# Patient Record
Sex: Female | Born: 1970 | Race: White | Hispanic: No | Marital: Married | Smoking: Never smoker
Health system: Southern US, Community
[De-identification: ages and names within clinical notes are randomized; demographics above are authoritative.]

## PROBLEM LIST (undated history)

## (undated) DIAGNOSIS — F32A Depression, unspecified: Secondary | ICD-10-CM

## (undated) DIAGNOSIS — B019 Varicella without complication: Secondary | ICD-10-CM

## (undated) DIAGNOSIS — F329 Major depressive disorder, single episode, unspecified: Secondary | ICD-10-CM

## (undated) DIAGNOSIS — G43909 Migraine, unspecified, not intractable, without status migrainosus: Secondary | ICD-10-CM

## (undated) HISTORY — DX: Varicella without complication: B01.9

## (undated) HISTORY — DX: Major depressive disorder, single episode, unspecified: F32.9

## (undated) HISTORY — PX: CERVICAL BIOPSY  W/ LOOP ELECTRODE EXCISION: SUR135

## (undated) HISTORY — DX: Migraine, unspecified, not intractable, without status migrainosus: G43.909

## (undated) HISTORY — DX: Depression, unspecified: F32.A

---
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
## 2007-11-24 LAB — HM MAMMOGRAPHY

## 2008-09-05 ENCOUNTER — Ambulatory Visit: Payer: Self-pay

## 2011-01-02 ENCOUNTER — Ambulatory Visit: Payer: Self-pay

## 2011-01-08 ENCOUNTER — Ambulatory Visit: Payer: Self-pay

## 2012-10-13 ENCOUNTER — Other Ambulatory Visit: Payer: Self-pay | Admitting: Dermatology

## 2012-10-13 LAB — CBC WITH DIFFERENTIAL/PLATELET
Eosinophil #: 0.1 10*3/uL (ref 0.0–0.7)
Eosinophil %: 1.1 %
Lymphocyte #: 2.2 10*3/uL (ref 1.0–3.6)
MCH: 26.9 pg (ref 26.0–34.0)
MCV: 82 fL (ref 80–100)
Monocyte #: 0.5 x10 3/mm (ref 0.2–0.9)
Neutrophil %: 63.6 %
Platelet: 281 10*3/uL (ref 150–440)
RBC: 4.69 10*6/uL (ref 3.80–5.20)
WBC: 7.6 10*3/uL (ref 3.6–11.0)

## 2012-10-13 LAB — TRIGLYCERIDES: Triglycerides: 114 mg/dL (ref 0–200)

## 2012-10-13 LAB — ALT: SGPT (ALT): 18 U/L (ref 12–78)

## 2012-12-05 ENCOUNTER — Other Ambulatory Visit: Payer: Self-pay | Admitting: Dermatology

## 2012-12-05 LAB — CBC WITH DIFFERENTIAL/PLATELET
HCT: 39.8 % (ref 35.0–47.0)
Lymphocyte #: 2.2 10*3/uL (ref 1.0–3.6)
Lymphocyte %: 30.9 %
MCHC: 32.6 g/dL (ref 32.0–36.0)
MCV: 83 fL (ref 80–100)
Monocyte #: 0.4 x10 3/mm (ref 0.2–0.9)
Neutrophil #: 4.5 10*3/uL (ref 1.4–6.5)
Neutrophil %: 61.7 %
Platelet: 283 10*3/uL (ref 150–440)
RBC: 4.82 10*6/uL (ref 3.80–5.20)
WBC: 7.3 10*3/uL (ref 3.6–11.0)

## 2012-12-07 ENCOUNTER — Other Ambulatory Visit: Payer: Self-pay | Admitting: Dermatology

## 2012-12-07 LAB — TRIGLYCERIDES: Triglycerides: 117 mg/dL (ref 0–200)

## 2013-01-03 ENCOUNTER — Other Ambulatory Visit: Payer: Self-pay | Admitting: Dermatology

## 2013-01-03 LAB — CBC WITH DIFFERENTIAL/PLATELET
Basophil #: 0.1 10*3/uL (ref 0.0–0.1)
Basophil %: 1.1 %
Eosinophil %: 2.2 %
HCT: 40.2 % (ref 35.0–47.0)
HGB: 13.6 g/dL (ref 12.0–16.0)
Lymphocyte #: 3.2 10*3/uL (ref 1.0–3.6)
Monocyte #: 0.4 x10 3/mm (ref 0.2–0.9)
RBC: 4.9 10*6/uL (ref 3.80–5.20)
RDW: 14 % (ref 11.5–14.5)

## 2013-01-03 LAB — ALT: SGPT (ALT): 33 U/L (ref 12–78)

## 2013-01-03 LAB — SGOT (AST)(ARMC): SGOT(AST): 13 U/L — ABNORMAL LOW (ref 15–37)

## 2013-02-21 LAB — HM PAP SMEAR

## 2013-03-06 ENCOUNTER — Other Ambulatory Visit: Payer: Self-pay | Admitting: Dermatology

## 2013-03-06 LAB — CBC WITH DIFFERENTIAL/PLATELET
Basophil %: 1.2 %
Eosinophil %: 2.3 %
HGB: 12.9 g/dL (ref 12.0–16.0)
Lymphocyte %: 31.4 %
MCH: 26.8 pg (ref 26.0–34.0)
MCHC: 32.4 g/dL (ref 32.0–36.0)
Monocyte #: 0.4 x10 3/mm (ref 0.2–0.9)
Neutrophil %: 58.7 %
Platelet: 295 10*3/uL (ref 150–440)
RBC: 4.82 10*6/uL (ref 3.80–5.20)
RDW: 14.2 % (ref 11.5–14.5)
WBC: 6.7 10*3/uL (ref 3.6–11.0)

## 2013-03-06 LAB — SGOT (AST)(ARMC): SGOT(AST): 14 U/L — ABNORMAL LOW (ref 15–37)

## 2013-04-03 ENCOUNTER — Other Ambulatory Visit: Payer: Self-pay | Admitting: Dermatology

## 2013-04-03 LAB — CBC WITH DIFFERENTIAL/PLATELET
Basophil #: 0.1 10*3/uL (ref 0.0–0.1)
Eosinophil #: 0.2 10*3/uL (ref 0.0–0.7)
Eosinophil %: 2.7 %
Lymphocyte %: 34.2 %
MCH: 27.4 pg (ref 26.0–34.0)
Neutrophil %: 56 %
RBC: 4.7 10*6/uL (ref 3.80–5.20)
RDW: 14.3 % (ref 11.5–14.5)
WBC: 7.1 10*3/uL (ref 3.6–11.0)

## 2013-04-03 LAB — TRIGLYCERIDES: Triglycerides: 111 mg/dL (ref 0–200)

## 2013-04-03 LAB — SGOT (AST)(ARMC): SGOT(AST): 17 U/L (ref 15–37)

## 2013-04-03 LAB — ALT: SGPT (ALT): 22 U/L (ref 12–78)

## 2014-02-05 ENCOUNTER — Encounter: Payer: Self-pay | Admitting: Adult Health

## 2014-02-05 ENCOUNTER — Encounter (INDEPENDENT_AMBULATORY_CARE_PROVIDER_SITE_OTHER): Payer: Self-pay

## 2014-02-05 ENCOUNTER — Ambulatory Visit (INDEPENDENT_AMBULATORY_CARE_PROVIDER_SITE_OTHER): Payer: 59 | Admitting: Adult Health

## 2014-02-05 VITALS — BP 132/88 | HR 64 | Temp 98.1°F | Resp 12 | Ht 66.0 in | Wt 171.0 lb

## 2014-02-05 DIAGNOSIS — Z0000 Encounter for general adult medical examination without abnormal findings: Secondary | ICD-10-CM

## 2014-02-05 NOTE — Progress Notes (Signed)
Pre visit review using our clinic review tool, if applicable. No additional management support is needed unless otherwise documented below in the visit note. 

## 2014-02-05 NOTE — Progress Notes (Signed)
Patient ID: Stacy Haas, female   DOB: 1971-11-19, 43 y.o.   MRN: [id number]    Subjective:    Patient ID: Stacy Haas, female    DOB: [birth year], 43 y.o.   MRN: [id number]  HPI  Pt is a pleasant 43 y/o female who presents to establish care. She has not had a PCP in years. She would go for acute issues to employee health. She is followed by OB/GYN and up to date on mammogram and PAP. She is feeling well overall.     Past Medical History  Diagnosis Date  . Migraine   . Chicken pox      Past Surgical History  Procedure Laterality Date  . Hernia repair  1978    ingunial  . Cervical biopsy  w/ loop electrode excision  2012 or 2013    Dr. Luella Cook     Family History  Problem Relation Age of Onset  . Heart disease Mother   . Diabetes Mother   . Depression Mother   . Heart disease Brother     MI age 72  . Cancer Maternal Aunt     breast cancer  . Diabetes Maternal Aunt   . Stroke Maternal Aunt   . Hypertension Maternal Aunt   . Cancer Maternal Grandmother     colon cancer  . Hypertension Maternal Grandfather   . Diabetes Maternal Grandfather   . Hypertension Daughter      History   Social History  . Marital Status: Married    Spouse Name: Hether Anselmo    Number of Children: 1  . Years of Education: 14   Occupational History  . [employer] Medicine [employer]   Social History Main Topics  . Smoking status: Never Smoker   . Smokeless tobacco: Never Used  . Alcohol Use: No  . Drug Use: No  . Sexual Activity: Not on file   Other Topics Concern  . Not on file   Social History Narrative   Stacy Haas grew up in [place], [place]. She lives in [place] with her husband and daughter. 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 She enjoys traveling to [place] and mountains. She loves spending time with her family.     Review of Systems  Constitutional: Negative.   HENT: Negative.   Eyes: Negative.     Respiratory: Negative.   Cardiovascular: Negative.   Gastrointestinal: Negative.   Endocrine: Negative.   Genitourinary: Negative.   Musculoskeletal: Negative.   Skin: Negative.   Allergic/Immunologic: Negative.   Neurological: Negative.   Hematological: Negative.   Psychiatric/Behavioral: Negative.        Objective:  BP 132/88  Pulse 64  Temp(Src) 98.1 F (36.7 C) (Oral)  Resp 12  Ht 5\' 6"  (1.676 m)  Wt 171 lb (77.565 kg)  BMI 27.61 kg/m2  SpO2 97%   Physical Exam  Constitutional: She is oriented to person, place, and time. She appears well-developed and well-nourished. No distress.  HENT:  Head: Normocephalic and atraumatic.  Right Ear: External ear normal.  Left Ear: External ear normal.  Nose: Nose normal.  Mouth/Throat: Oropharynx is clear and moist.  Eyes: Conjunctivae and EOM are normal. Pupils are equal, round, and reactive to light.  Neck: Normal range of motion. Neck supple. No tracheal deviation present. No thyromegaly present.  Cardiovascular: Normal rate, regular rhythm, normal heart sounds and intact distal pulses.  Exam reveals no gallop and no friction rub.   No murmur  heard. Pulmonary/Chest: Effort normal and breath sounds normal. No respiratory distress. She has no wheezes. She has no rales.  Abdominal: Soft. Bowel sounds are normal. She exhibits no distension and no mass. There is no tenderness. There is no rebound and no guarding.  Musculoskeletal: Normal range of motion. She exhibits no edema and no tenderness.  Lymphadenopathy:    She has no cervical adenopathy.  Neurological: She is alert and oriented to person, place, and time. She has normal reflexes. No cranial nerve deficit. Coordination normal.  Skin: Skin is warm and dry.  Psychiatric: She has a normal mood and affect. Her behavior is normal. Judgment and thought content normal.       Assessment & Plan:   1. Routine general medical examination at a health care facility Normal physical  exam excluding breast, pelvic/PAP. [facility] and is scheduled for her yearly PAP next month. She will have them send me a report. Check routine labs: cbc, tsh, cmet, lipids, vit d and b12. She will return to have blood work done 2/2 to not fasting. Mammogram 02/06/14. PAP 03/07/14.

## 2014-02-06 ENCOUNTER — Ambulatory Visit: Payer: Self-pay

## 2014-02-08 ENCOUNTER — Other Ambulatory Visit (INDEPENDENT_AMBULATORY_CARE_PROVIDER_SITE_OTHER): Payer: 59

## 2014-02-08 DIAGNOSIS — Z0000 Encounter for general adult medical examination without abnormal findings: Secondary | ICD-10-CM

## 2014-02-08 LAB — COMPREHENSIVE METABOLIC PANEL
ALT: 16 U/L (ref 0–35)
AST: 15 U/L (ref 0–37)
Albumin: 3.6 g/dL (ref 3.5–5.2)
Alkaline Phosphatase: 74 U/L (ref 39–117)
BUN: 12 mg/dL (ref 6–23)
CALCIUM: 8.5 mg/dL (ref 8.4–10.5)
CHLORIDE: 104 meq/L (ref 96–112)
CO2: 24 meq/L (ref 19–32)
Creatinine, Ser: 0.7 mg/dL (ref 0.4–1.2)
GFR: 100.75 mL/min (ref >60.00)
Glucose, Bld: 90 mg/dL (ref 70–99)
Potassium: 4 mEq/L (ref 3.5–5.1)
Sodium: 139 mEq/L (ref 135–145)
Total Bilirubin: 0.7 mg/dL (ref 0.3–1.2)
Total Protein: 7.1 g/dL (ref 6.0–8.3)

## 2014-02-08 LAB — LIPID PANEL
CHOLESTEROL: 218 mg/dL — AB (ref 0–200)
HDL: 62.6 mg/dL (ref >39.00)
LDL Cholesterol: 141 mg/dL — ABNORMAL HIGH (ref 0–99)
TRIGLYCERIDES: 73 mg/dL (ref 0.0–149.0)
Total CHOL/HDL Ratio: 3
VLDL: 14.6 mg/dL (ref 0.0–40.0)

## 2014-02-08 LAB — CBC WITH DIFFERENTIAL/PLATELET
BASOS ABS: 0 10*3/uL (ref 0.0–0.1)
Basophils Relative: 0.6 % (ref 0.0–3.0)
EOS ABS: 0.1 10*3/uL (ref 0.0–0.7)
Eosinophils Relative: 1.9 % (ref 0.0–5.0)
HCT: 40.9 % (ref 36.0–46.0)
Hemoglobin: 13.3 g/dL (ref 12.0–15.0)
Lymphocytes Relative: 30.5 % (ref 12.0–46.0)
Lymphs Abs: 2.3 10*3/uL (ref 0.7–4.0)
MCHC: 32.6 g/dL (ref 30.0–36.0)
MCV: 83.8 fl (ref 78.0–100.0)
MONO ABS: 0.4 10*3/uL (ref 0.1–1.0)
Monocytes Relative: 5.4 % (ref 3.0–12.0)
NEUTROS PCT: 61.6 % (ref 43.0–77.0)
Neutro Abs: 4.6 10*3/uL (ref 1.4–7.7)
PLATELETS: 289 10*3/uL (ref 150.0–400.0)
RBC: 4.88 Mil/uL (ref 3.87–5.11)
RDW: 14.6 % (ref 11.5–14.6)
WBC: 7.5 10*3/uL (ref 4.5–10.5)

## 2014-02-08 LAB — VITAMIN B12: VITAMIN B 12: 287 pg/mL (ref 211–911)

## 2014-02-08 LAB — TSH: TSH: 1.8 u[IU]/mL (ref 0.35–5.50)

## 2014-02-09 LAB — VITAMIN D 25 HYDROXY (VIT D DEFICIENCY, FRACTURES): Vit D, 25-Hydroxy: 20 ng/mL — ABNORMAL LOW (ref 30–89)

## 2014-02-13 ENCOUNTER — Other Ambulatory Visit: Payer: Self-pay | Admitting: Adult Health

## 2014-02-13 MED ORDER — ERGOCALCIFEROL 1.25 MG (50000 UT) PO CAPS: 50000.0000 [IU] | ORAL_CAPSULE | ORAL | Status: DC

## 2014-04-14 IMAGING — MG MM DIGITAL SCREENING BILAT W/ CAD
1 series · 4 of 4 positions shown · non-contrast
Comparison: Previous exam(s).

CLINICAL DATA: Screening.

EXAM:
DIGITAL SCREENING BILATERAL MAMMOGRAM WITH CAD

[R CC · right · 4 of 4 slices shown]
[im 1/4]
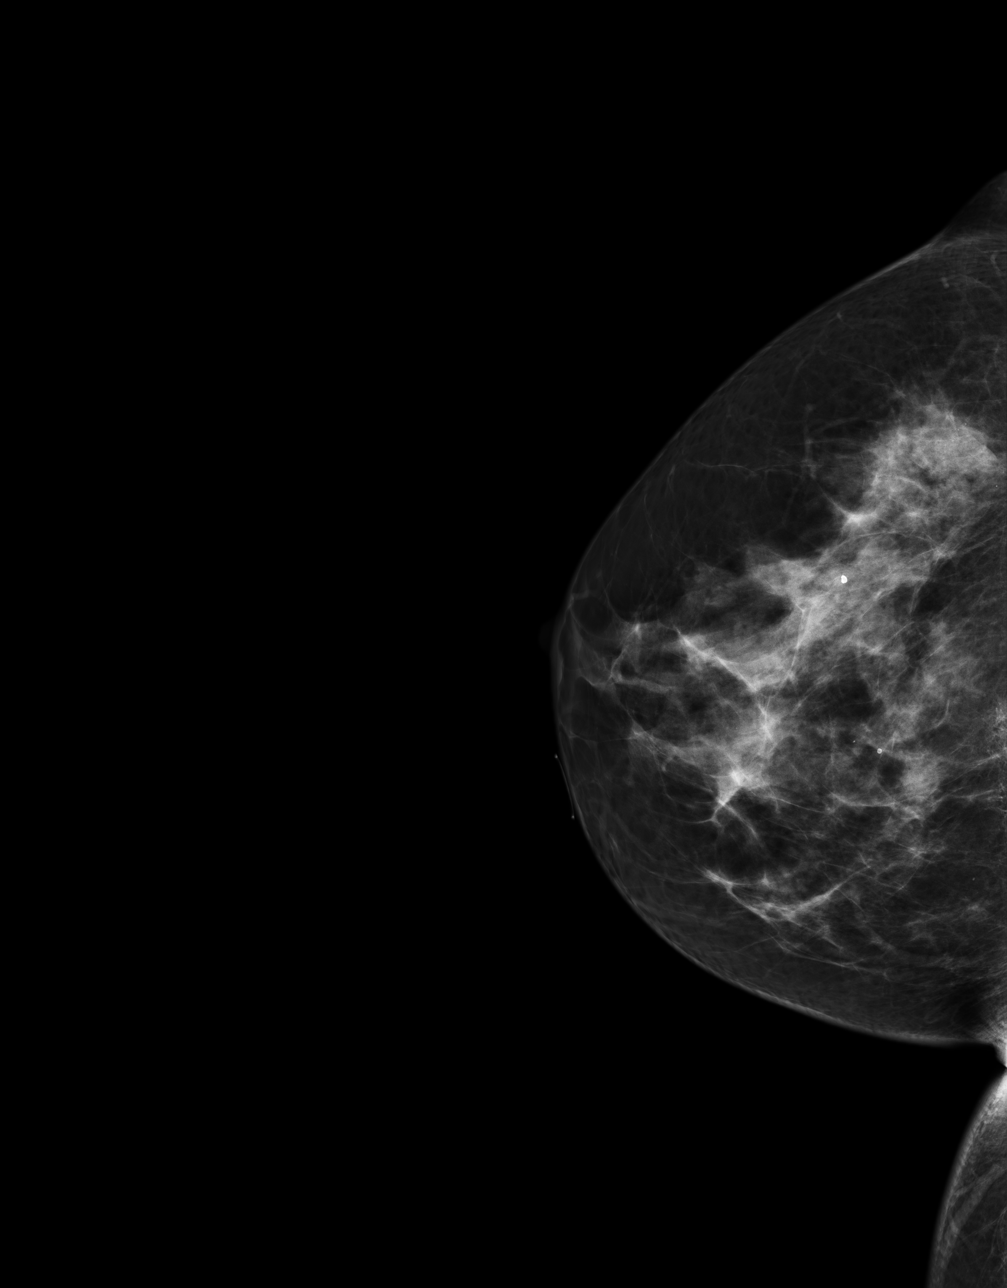
[im 2/4]
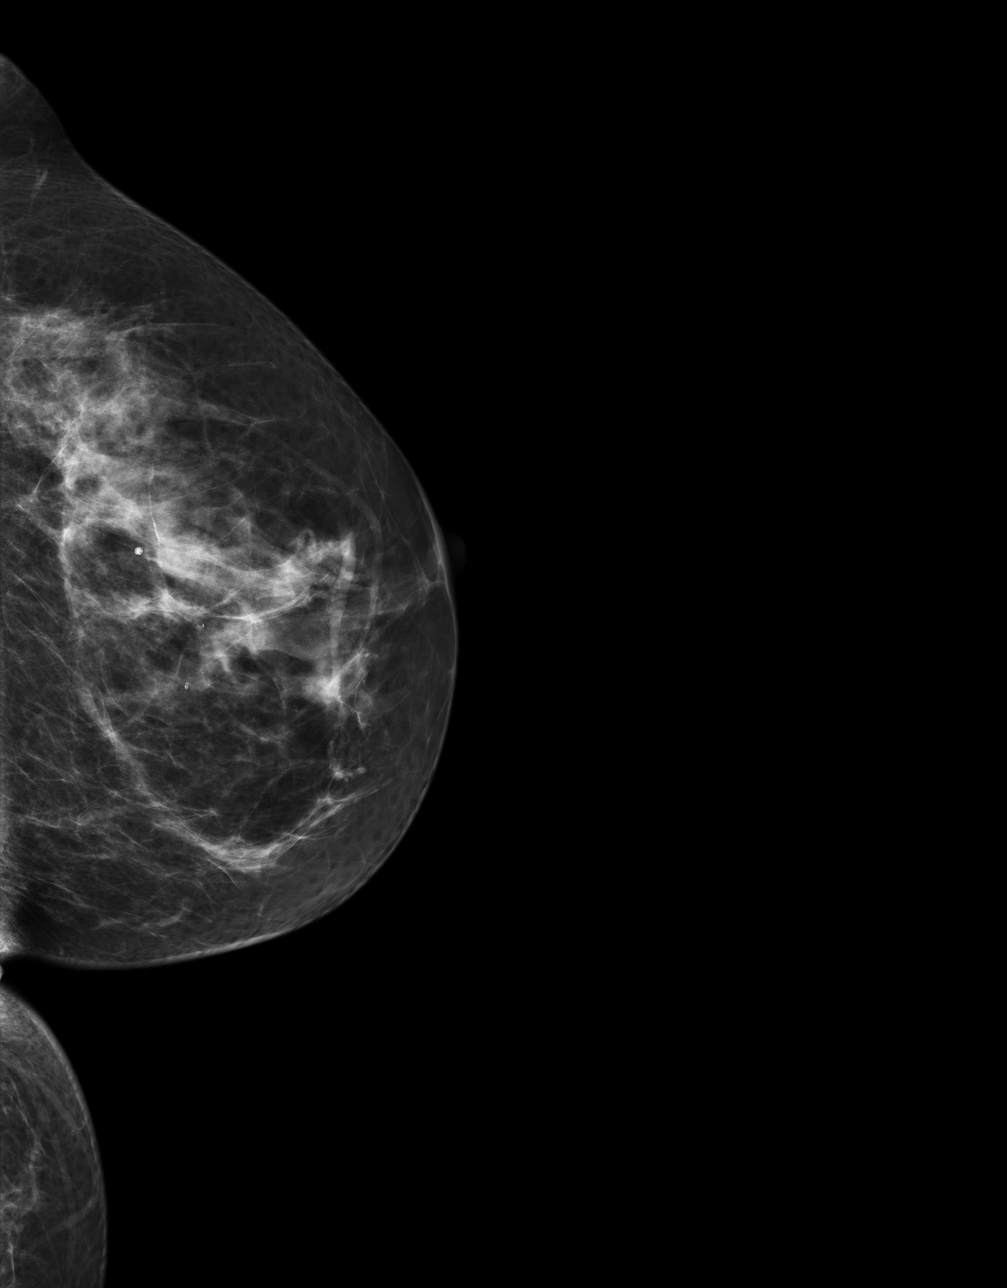
[im 3/4]
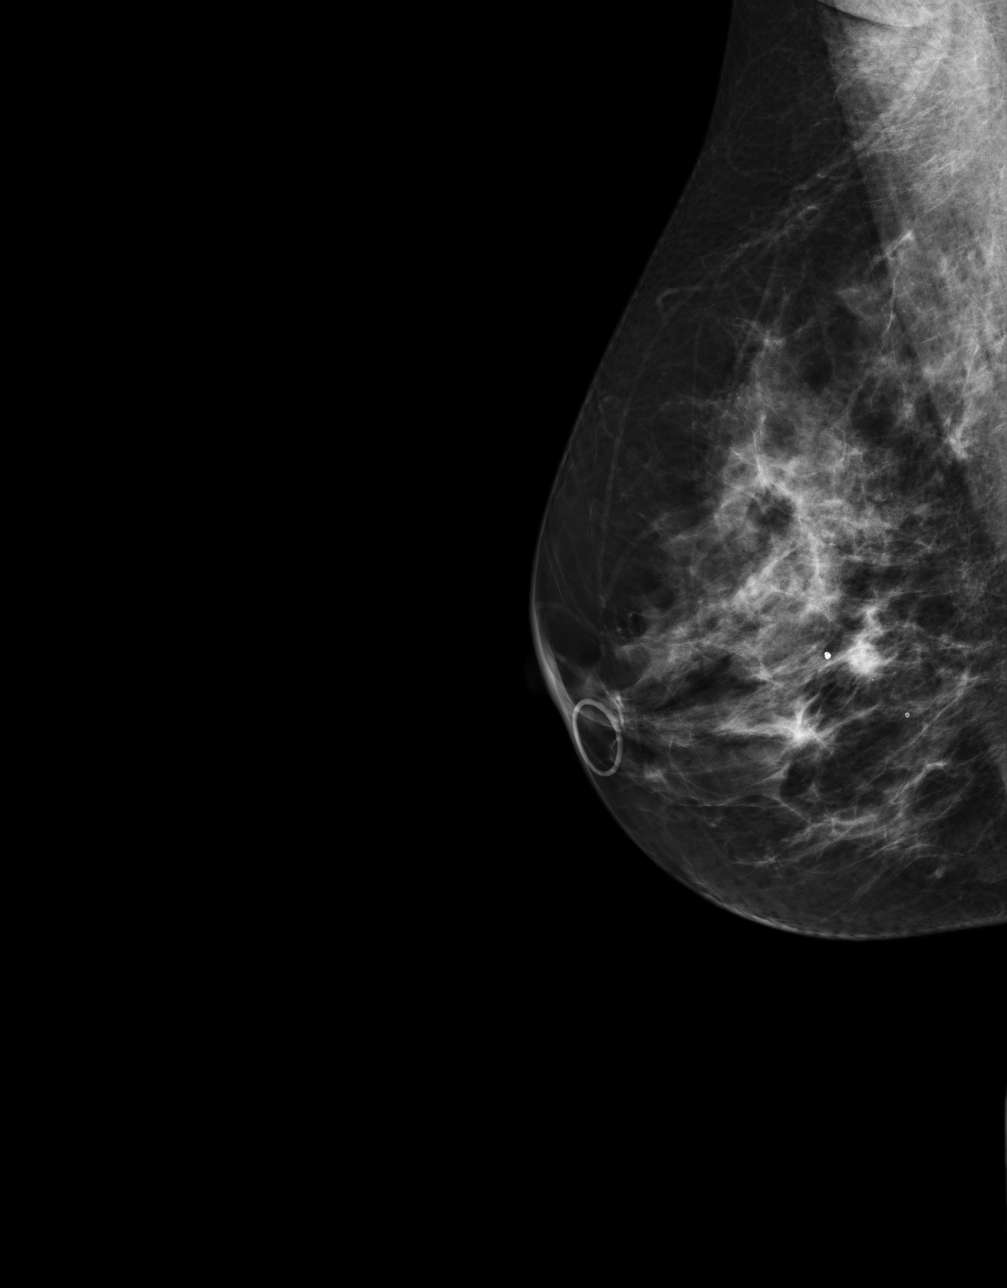
[im 4/4]
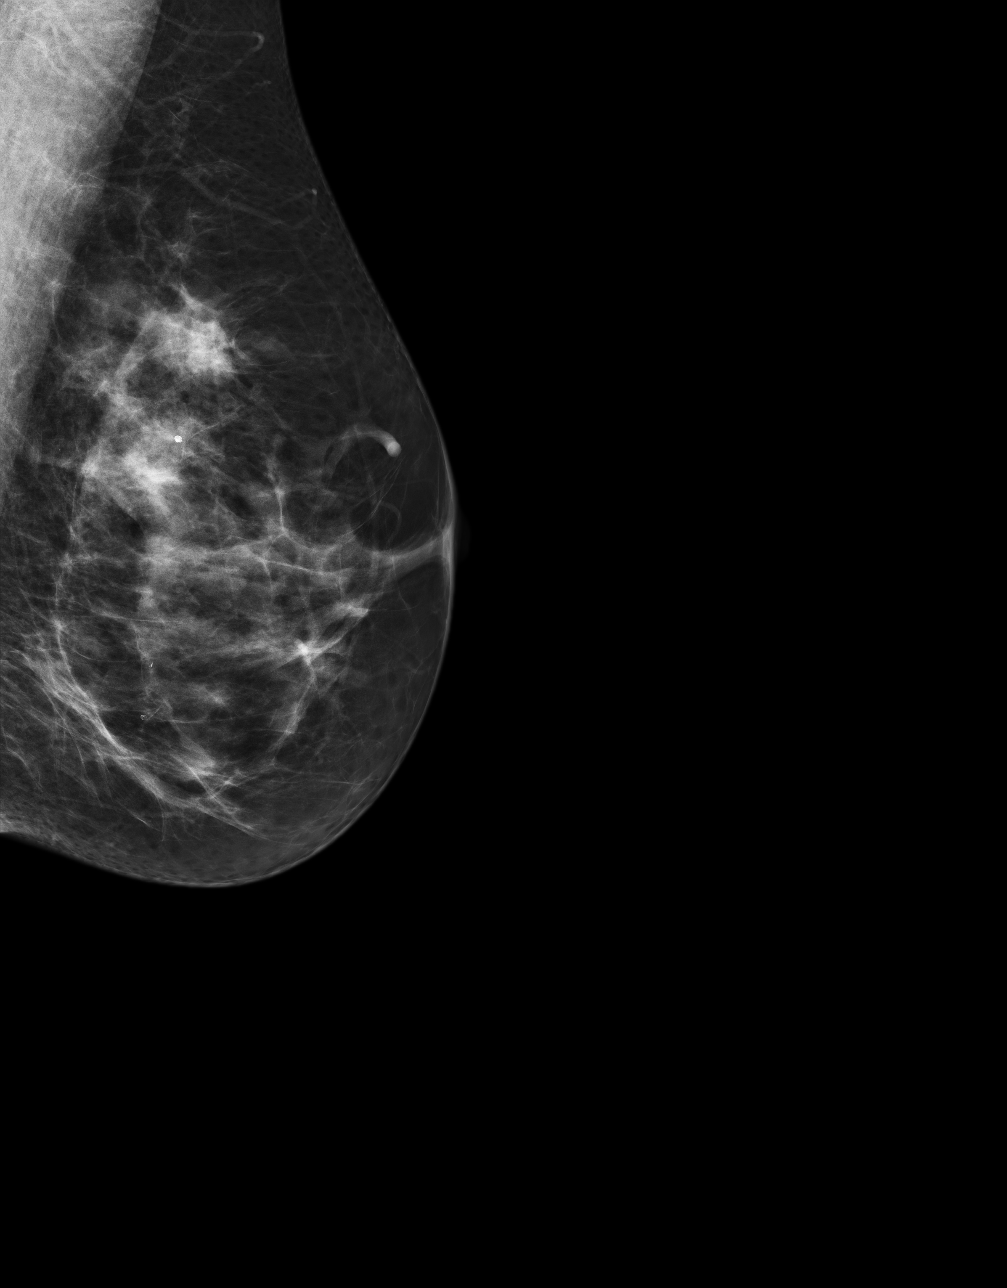

[4 of 4 positions shown; findings below may reference images not displayed]

ACR Breast Density Category c: The breast tissue is heterogeneously
dense, which may obscure small masses.
FINDINGS: There are no findings suspicious for malignancy. Images were
processed with CAD.
IMPRESSION: No mammographic evidence of malignancy. A result letter of this
screening mammogram will be mailed directly to the patient.

RECOMMENDATION:
Screening mammogram in one year. (Code:YJ-[id number])

BI-RADS CATEGORY  1: Negative.

## 2014-07-27 ENCOUNTER — Ambulatory Visit: Payer: 59 | Admitting: Family Medicine

## 2016-01-30 ENCOUNTER — Encounter: Payer: Self-pay | Admitting: Nurse Practitioner

## 2016-01-30 ENCOUNTER — Ambulatory Visit (INDEPENDENT_AMBULATORY_CARE_PROVIDER_SITE_OTHER): Payer: BC Managed Care – PPO | Admitting: Nurse Practitioner

## 2016-01-30 VITALS — BP 126/84 | HR 62 | Temp 98.1°F | Resp 14 | Ht 66.0 in | Wt 172.4 lb

## 2016-01-30 DIAGNOSIS — Z0000 Encounter for general adult medical examination without abnormal findings: Secondary | ICD-10-CM

## 2016-01-30 DIAGNOSIS — E559 Vitamin D deficiency, unspecified: Secondary | ICD-10-CM | POA: Diagnosis not present

## 2016-01-30 NOTE — Progress Notes (Signed)
Patient ID: Stacy Haas, female    DOB: 03-17-1971  Age: 45 y.o. MRN: [id number]  CC: Annual Exam   HPI Stacy Haas presents for Annual Exam.  1) Annual Physical   Diet- Healthy choices  Exercise- [employer], 2-3 x a week 30-1 hr   Immunizations-    Flu- [facility] 10/16   Tdap- 2015  Mammogram- 2017 May   Pap is due in April- currently on cycle day 2  Eye Exam- UTD  Dental Exam- UTD  LMP- 01/29/16   Labs- Non-fasting   Fall- Neg.   Depression- Neg.  Refills: None   History [name] has a past medical history of Migraine; Chicken pox; and Depression.   She has past surgical history that includes Hernia repair (1978) and Cervical biopsy w/ loop electrode excision (2012 or 2013).   Her family history includes Cancer in her maternal aunt and maternal grandmother; Depression in her mother; Diabetes in her maternal aunt, maternal grandfather, and mother; Heart disease in her brother and mother; Hypertension in her daughter, maternal aunt, and maternal grandfather; Stroke in her maternal aunt.She reports that she has never smoked. She has never used smokeless tobacco. She reports that she does not drink alcohol or use illicit drugs.  Outpatient Prescriptions Prior to Visit  Medication Sig Dispense Refill  . Multiple Vitamin (MULTIVITAMIN) tablet Take 1 tablet by mouth daily.    . Norgestimate-Ethinyl Estradiol Triphasic (ORTHO TRI-CYCLEN, 28,) 0.18/0.215/0.25 MG-35 MCG tablet Take 1 tablet by mouth daily.    . ergocalciferol (DRISDOL) 50000 UNITS capsule Take 1 capsule (50,000 Units total) by mouth once a week. Take once weekly for 12 weeks (Patient not taking: Reported on 01/30/2016) 4 capsule 2   No facility-administered medications prior to visit.    ROS Review of Systems  Constitutional: Negative for fever, chills, diaphoresis, fatigue and unexpected weight change.  HENT: Negative for tinnitus and trouble swallowing.   Eyes: Negative for visual disturbance.  Respiratory: Negative for  chest tightness, shortness of breath and wheezing.   Cardiovascular: Negative for chest pain, palpitations and leg swelling.  Gastrointestinal: Negative for nausea, vomiting, abdominal pain, diarrhea, constipation and blood in stool.  Endocrine: Negative for polydipsia, polyphagia and polyuria.  Genitourinary: Negative for dysuria, hematuria, vaginal discharge and vaginal pain.  Musculoskeletal: Negative for myalgias, back pain, arthralgias and gait problem.  Skin: Negative for color change and rash.  Neurological: Negative for dizziness, weakness, numbness and headaches.  Hematological: Does not bruise/bleed easily.  Psychiatric/Behavioral: Negative for suicidal ideas and sleep disturbance. The patient is not nervous/anxious.     Objective:  BP 126/84 mmHg  Pulse 62  Temp(Src) 98.1 F (36.7 C) (Oral)  Resp 14  Ht  (1.676 m)  Wt 172 lb 6.4 oz (78.2 kg)  BMI 27.84 kg/m2  SpO2 97%  LMP 01/29/2016  Physical Exam  Constitutional: She is oriented to person, place, and time. She appears well-developed and well-nourished. No distress.  HENT:  Head: Normocephalic and atraumatic.  Right Ear: External ear normal.  Left Ear: External ear normal.  Nose: Nose normal.  Mouth/Throat: Oropharynx is clear and moist. No oropharyngeal exudate.  TMs and canals clear bilaterally  Eyes: Conjunctivae and EOM are normal. Pupils are equal, round, and reactive to light. Right eye exhibits no discharge. Left eye exhibits no discharge. No scleral icterus.  Neck: Normal range of motion. Neck supple. No thyromegaly present.  Cardiovascular: Normal rate, regular rhythm, normal heart sounds and intact distal pulses.  Exam reveals no gallop and  no friction rub.   No murmur heard. Pulmonary/Chest: Effort normal and breath sounds normal. No respiratory distress. She has no wheezes. She has no rales. She exhibits no tenderness.  Clinical breast exam performed without significant findings  Abdominal: Soft.  Bowel sounds are normal. She exhibits no distension and no mass. There is no tenderness. There is no rebound and no guarding.  Genitourinary:  Deferred due to cycle today  Musculoskeletal: Normal range of motion. She exhibits no edema or tenderness.  Lymphadenopathy:    She has no cervical adenopathy.  Neurological: She is alert and oriented to person, place, and time. She has normal reflexes. No cranial nerve deficit. She exhibits normal muscle tone. Coordination normal.  Skin: Skin is warm and dry. No rash noted. She is not diaphoretic. No erythema. No pallor.  Psychiatric: She has a normal mood and affect. Her behavior is normal. Judgment and thought content normal.   Assessment & Plan:   Pincus LargeLenore was seen today for annual exam.  Diagnoses and all orders for this visit:  Routine general medical examination at a health care facility -     CBC with Differential/Platelet; Future -     Comprehensive metabolic panel; Future -     Hemoglobin A1c; Future -     Lipid panel; Future -     TSH; Future  Vitamin D deficiency -     VITAMIN D 25 Hydroxy (Vit-D Deficiency, Fractures); Future   I am having Ms. Harju maintain her Norgestimate-Ethinyl Estradiol Triphasic and multivitamin.  No orders of the defined types were placed in this encounter.     Follow-up: Return for Needs fasting lab appointment soon.

## 2016-01-30 NOTE — Patient Instructions (Signed)
Health Maintenance, Female Adopting a healthy lifestyle and getting preventive care can go a long way to promote health and wellness. Talk with your health care provider about what schedule of regular examinations is right for you. This is a good chance for you to check in with your provider about disease prevention and staying healthy. In between checkups, there are plenty of things you can do on your own. Experts have done a lot of research about which lifestyle changes and preventive measures are most likely to keep you healthy. Ask your health care provider for more information. WEIGHT AND DIET  Eat a healthy diet  Be sure to include plenty of vegetables, fruits, low-fat dairy products, and lean protein.  Do not eat a lot of foods high in solid fats, added sugars, or salt.  Get regular exercise. This is one of the most important things you can do for your health.  Most adults should exercise for at least 150 minutes each week. The exercise should increase your heart rate and make you sweat (moderate-intensity exercise).  Most adults should also do strengthening exercises at least twice a week. This is in addition to the moderate-intensity exercise.  Maintain a healthy weight  Body mass index (BMI) is a measurement that can be used to identify possible weight problems. It estimates body fat based on height and weight. Your health care provider can help determine your BMI and help you achieve or maintain a healthy weight.  For females 20 years of age and older:   A BMI below 18.5 is considered underweight.  A BMI of 18.5 to 24.9 is normal.  A BMI of 25 to 29.9 is considered overweight.  A BMI of 30 and above is considered obese.  Watch levels of cholesterol and blood lipids  You should start having your blood tested for lipids and cholesterol at 44 years of age, then have this test every 5 years.  You may need to have your cholesterol levels checked more often if:  Your lipid  or cholesterol levels are high.  You are older than 44 years of age.  You are at high risk for heart disease.  CANCER SCREENING   Lung Cancer  Lung cancer screening is recommended for adults 55-80 years old who are at high risk for lung cancer because of a history of smoking.  A yearly low-dose CT scan of the lungs is recommended for people who:  Currently smoke.  Have quit within the past 15 years.  Have at least a 30-pack-year history of smoking. A pack year is smoking an average of one pack of cigarettes a day for 1 year.  Yearly screening should continue until it has been 15 years since you quit.  Yearly screening should stop if you develop a health problem that would prevent you from having lung cancer treatment.  Breast Cancer  Practice breast self-awareness. This means understanding how your breasts normally appear and feel.  It also means doing regular breast self-exams. Let your health care provider know about any changes, no matter how small.  If you are in your 20s or 30s, you should have a clinical breast exam (CBE) by a health care provider every 1-3 years as part of a regular health exam.  If you are 40 or older, have a CBE every year. Also consider having a breast X-ray (mammogram) every year.  If you have a family history of breast cancer, talk to your health care provider about genetic screening.  If you   are at high risk for breast cancer, talk to your health care provider about having an MRI and a mammogram every year.  Breast cancer gene (BRCA) assessment is recommended for women who have family members with BRCA-related cancers. BRCA-related cancers include:  Breast.  Ovarian.  Tubal.  Peritoneal cancers.  Results of the assessment will determine the need for genetic counseling and BRCA1 and BRCA2 testing. Cervical Cancer Your health care provider may recommend that you be screened regularly for cancer of the pelvic organs (ovaries, uterus, and  vagina). This screening involves a pelvic examination, including checking for microscopic changes to the surface of your cervix (Pap test). You may be encouraged to have this screening done every 3 years, beginning at age 21.  For women ages 30-65, health care providers may recommend pelvic exams and Pap testing every 3 years, or they may recommend the Pap and pelvic exam, combined with testing for human papilloma virus (HPV), every 5 years. Some types of HPV increase your risk of cervical cancer. Testing for HPV may also be done on women of any age with unclear Pap test results.  Other health care providers may not recommend any screening for nonpregnant women who are considered low risk for pelvic cancer and who do not have symptoms. Ask your health care provider if a screening pelvic exam is right for you.  If you have had past treatment for cervical cancer or a condition that could lead to cancer, you need Pap tests and screening for cancer for at least 20 years after your treatment. If Pap tests have been discontinued, your risk factors (such as having a new sexual partner) need to be reassessed to determine if screening should resume. Some women have medical problems that increase the chance of getting cervical cancer. In these cases, your health care provider may recommend more frequent screening and Pap tests. Colorectal Cancer  This type of cancer can be detected and often prevented.  Routine colorectal cancer screening usually begins at 44 years of age and continues through 44 years of age.  Your health care provider may recommend screening at an earlier age if you have risk factors for colon cancer.  Your health care provider may also recommend using home test kits to check for hidden blood in the stool.  A small camera at the end of a tube can be used to examine your colon directly (sigmoidoscopy or colonoscopy). This is done to check for the earliest forms of colorectal  cancer.  Routine screening usually begins at age 50.  Direct examination of the colon should be repeated every 5-10 years through 44 years of age. However, you may need to be screened more often if early forms of precancerous polyps or small growths are found. Skin Cancer  Check your skin from head to toe regularly.  Tell your health care provider about any new moles or changes in moles, especially if there is a change in a mole's shape or color.  Also tell your health care provider if you have a mole that is larger than the size of a pencil eraser.  Always use sunscreen. Apply sunscreen liberally and repeatedly throughout the day.  Protect yourself by wearing long sleeves, pants, a wide-brimmed hat, and sunglasses whenever you are outside. HEART DISEASE, DIABETES, AND HIGH BLOOD PRESSURE   High blood pressure causes heart disease and increases the risk of stroke. High blood pressure is more likely to develop in:  People who have blood pressure in the high end   of the normal range (130-139/85-89 mm Hg).  People who are overweight or obese.  People who are African American.  If you are 38-23 years of age, have your blood pressure checked every 3-5 years. If you are 61 years of age or older, have your blood pressure checked every year. You should have your blood pressure measured twice--once when you are at a hospital or clinic, and once when you are not at a hospital or clinic. Record the average of the two measurements. To check your blood pressure when you are not at a hospital or clinic, you can use:  An automated blood pressure machine at a pharmacy.  A home blood pressure monitor.  If you are between 45 years and 39 years old, ask your health care provider if you should take aspirin to prevent strokes.  Have regular diabetes screenings. This involves taking a blood sample to check your fasting blood sugar level.  If you are at a normal weight and have a low risk for diabetes,  have this test once every three years after 44 years of age.  If you are overweight and have a high risk for diabetes, consider being tested at a younger age or more often. PREVENTING INFECTION  Hepatitis B  If you have a higher risk for hepatitis B, you should be screened for this virus. You are considered at high risk for hepatitis B if:  You were born in a country where hepatitis B is common. Ask your health care provider which countries are considered high risk.  Your parents were born in a high-risk country, and you have not been immunized against hepatitis B (hepatitis B vaccine).  You have HIV or AIDS.  You use needles to inject street drugs.  You live with someone who has hepatitis B.  You have had sex with someone who has hepatitis B.  You get hemodialysis treatment.  You take certain medicines for conditions, including cancer, organ transplantation, and autoimmune conditions. Hepatitis C  Blood testing is recommended for:  Everyone born from [birth year] through 1964.  Anyone with known risk factors for hepatitis C. Sexually transmitted infections (STIs)  You should be screened for sexually transmitted infections (STIs) including gonorrhea and chlamydia if:  You are sexually active and are younger than 44 years of age.  You are older than 44 years of age and your health care provider tells you that you are at risk for this type of infection.  Your sexual activity has changed since you were last screened and you are at an increased risk for chlamydia or gonorrhea. Ask your health care provider if you are at risk.  If you do not have HIV, but are at risk, it may be recommended that you take a prescription medicine daily to prevent HIV infection. This is called pre-exposure prophylaxis (PrEP). You are considered at risk if:  You are sexually active and do not regularly use condoms or know the HIV status of your partner(s).  You take drugs by injection.  You are sexually  active with a partner who has HIV. Talk with your health care provider about whether you are at high risk of being infected with HIV. If you choose to begin PrEP, you should first be tested for HIV. You should then be tested every 3 months for as long as you are taking PrEP.  PREGNANCY   If you are premenopausal and you may become pregnant, ask your health care provider about preconception counseling.  If you may  become pregnant, take 400 to 800 micrograms (mcg) of folic acid every day.  If you want to prevent pregnancy, talk to your health care provider about birth control (contraception). OSTEOPOROSIS AND MENOPAUSE   Osteoporosis is a disease in which the bones lose minerals and strength with aging. This can result in serious bone fractures. Your risk for osteoporosis can be identified using a bone density scan.  If you are 61 years of age or older, or if you are at risk for osteoporosis and fractures, ask your health care provider if you should be screened.  Ask your health care provider whether you should take a calcium or vitamin D supplement to lower your risk for osteoporosis.  Menopause may have certain physical symptoms and risks.  Hormone replacement therapy may reduce some of these symptoms and risks. Talk to your health care provider about whether hormone replacement therapy is right for you.  HOME CARE INSTRUCTIONS   Schedule regular health, dental, and eye exams.  Stay current with your immunizations.   Do not use any tobacco products including cigarettes, chewing tobacco, or electronic cigarettes.  If you are pregnant, do not drink alcohol.  If you are breastfeeding, limit how much and how often you drink alcohol.  Limit alcohol intake to no more than 1 drink per day for nonpregnant women. One drink equals 12 ounces of beer, 5 ounces of wine, or 1 ounces of hard liquor.  Do not use street drugs.  Do not share needles.  Ask your health care provider for help if  you need support or information about quitting drugs.  Tell your health care provider if you often feel depressed.  Tell your health care provider if you have ever been abused or do not feel safe at home.   This information is not intended to replace advice given to you by your health care provider. Make sure you discuss any questions you have with your health care provider.   Document Released: 05/25/2011 Document Revised: 11/30/2014 Document Reviewed: 10/11/2013 Elsevier Interactive Patient Education [employer].

## 2016-01-30 NOTE — Assessment & Plan Note (Addendum)
Discussed acute and chronic issues. Reviewed health maintenance measures, PFSHx, and immunizations. Obtain routine labs TSH, Lipid panel, CBC w/ diff, A1c, and CMET.   Health maintenance was double checked to make up-to-date today

## 2016-02-03 ENCOUNTER — Other Ambulatory Visit (INDEPENDENT_AMBULATORY_CARE_PROVIDER_SITE_OTHER): Payer: BC Managed Care – PPO

## 2016-02-03 DIAGNOSIS — Z0000 Encounter for general adult medical examination without abnormal findings: Secondary | ICD-10-CM

## 2016-02-03 DIAGNOSIS — E559 Vitamin D deficiency, unspecified: Secondary | ICD-10-CM

## 2016-02-03 LAB — CBC WITH DIFFERENTIAL/PLATELET
BASOS PCT: 0.9 % (ref 0.0–3.0)
Basophils Absolute: 0.1 10*3/uL (ref 0.0–0.1)
EOS PCT: 1.6 % (ref 0.0–5.0)
Eosinophils Absolute: 0.1 10*3/uL (ref 0.0–0.7)
HCT: 40.3 % (ref 36.0–46.0)
HEMOGLOBIN: 13.4 g/dL (ref 12.0–15.0)
LYMPHS ABS: 2.6 10*3/uL (ref 0.7–4.0)
Lymphocytes Relative: 32.3 % (ref 12.0–46.0)
MCHC: 33.2 g/dL (ref 30.0–36.0)
MCV: 82.8 fl (ref 78.0–100.0)
MONOS PCT: 4.4 % (ref 3.0–12.0)
Monocytes Absolute: 0.4 10*3/uL (ref 0.1–1.0)
Neutro Abs: 4.9 10*3/uL (ref 1.4–7.7)
Neutrophils Relative %: 60.8 % (ref 43.0–77.0)
Platelets: 307 10*3/uL (ref 150.0–400.0)
RBC: 4.86 Mil/uL (ref 3.87–5.11)
RDW: 14.3 % (ref 11.5–15.5)
WBC: 8 10*3/uL (ref 4.0–10.5)

## 2016-02-03 LAB — LIPID PANEL
Cholesterol: 185 mg/dL (ref 0–200)
HDL: 60.1 mg/dL (ref >39.00)
LDL CALC: 106 mg/dL — AB (ref 0–99)
NONHDL: 124.8
Total CHOL/HDL Ratio: 3
Triglycerides: 93 mg/dL (ref 0.0–149.0)
VLDL: 18.6 mg/dL (ref 0.0–40.0)

## 2016-02-03 LAB — COMPREHENSIVE METABOLIC PANEL
ALBUMIN: 3.6 g/dL (ref 3.5–5.2)
ALT: 10 U/L (ref 0–35)
AST: 13 U/L (ref 0–37)
Alkaline Phosphatase: 61 U/L (ref 39–117)
BUN: 14 mg/dL (ref 6–23)
CO2: 26 mEq/L (ref 19–32)
Calcium: 8.7 mg/dL (ref 8.4–10.5)
Chloride: 104 mEq/L (ref 96–112)
Creatinine, Ser: 0.67 mg/dL (ref 0.40–1.20)
GFR: 101.54 mL/min (ref >60.00)
Glucose, Bld: 94 mg/dL (ref 70–99)
POTASSIUM: 3.9 meq/L (ref 3.5–5.1)
Sodium: 137 mEq/L (ref 135–145)
TOTAL PROTEIN: 6.7 g/dL (ref 6.0–8.3)
Total Bilirubin: 0.5 mg/dL (ref 0.2–1.2)

## 2016-02-03 LAB — HEMOGLOBIN A1C: Hgb A1c MFr Bld: 5.6 % (ref 4.6–6.5)

## 2016-02-03 LAB — VITAMIN D 25 HYDROXY (VIT D DEFICIENCY, FRACTURES): VITD: 14.8 ng/mL — ABNORMAL LOW (ref 30.00–100.00)

## 2016-02-03 LAB — TSH: TSH: 1.07 u[IU]/mL (ref 0.35–4.50)

## 2016-02-04 ENCOUNTER — Other Ambulatory Visit: Payer: Self-pay | Admitting: Nurse Practitioner

## 2016-02-04 ENCOUNTER — Telehealth: Payer: Self-pay

## 2016-02-04 MED ORDER — ERGOCALCIFEROL 1.25 MG (50000 UT) PO CAPS: 50000.0000 [IU] | ORAL_CAPSULE | ORAL | Status: AC

## 2016-02-04 NOTE — Telephone Encounter (Signed)
Notified pt of test results, pt verbalized understanding and appreciation 

## 2016-02-08 NOTE — Assessment & Plan Note (Addendum)
Checking vitamin D level since patient is deficient She is not currently on supplementation

## 2016-03-24 ENCOUNTER — Other Ambulatory Visit: Payer: Self-pay | Admitting: Unknown Physician Specialty

## 2016-03-24 DIAGNOSIS — Z1231 Encounter for screening mammogram for malignant neoplasm of breast: Secondary | ICD-10-CM

## 2016-04-10 ENCOUNTER — Ambulatory Visit: Payer: BC Managed Care – PPO

## 2016-04-24 ENCOUNTER — Ambulatory Visit
Admission: RE | Admit: 2016-04-24 | Discharge: 2016-04-24 | Disposition: A | Discharge status: Home / Self Care | Payer: BC Managed Care – PPO | Source: Ambulatory Visit | Attending: Unknown Physician Specialty | Admitting: Unknown Physician Specialty

## 2016-04-24 DIAGNOSIS — Z1231 Encounter for screening mammogram for malignant neoplasm of breast: Secondary | ICD-10-CM | POA: Insufficient documentation

## 2016-12-07 ENCOUNTER — Other Ambulatory Visit: Payer: Self-pay | Admitting: Nurse Practitioner

## 2017-03-10 ENCOUNTER — Other Ambulatory Visit: Payer: Self-pay | Admitting: Nurse Practitioner

## 2017-03-10 ENCOUNTER — Encounter: Payer: Self-pay | Admitting: Obstetrics and Gynecology

## 2017-03-10 DIAGNOSIS — Z1239 Encounter for other screening for malignant neoplasm of breast: Secondary | ICD-10-CM

## 2017-04-26 ENCOUNTER — Ambulatory Visit
Admission: RE | Admit: 2017-04-26 | Discharge: 2017-04-26 | Disposition: A | Discharge status: Home / Self Care | Payer: BLUE CROSS/BLUE SHIELD | Source: Ambulatory Visit | Attending: Nurse Practitioner | Admitting: Nurse Practitioner

## 2017-04-26 DIAGNOSIS — Z1231 Encounter for screening mammogram for malignant neoplasm of breast: Secondary | ICD-10-CM | POA: Diagnosis not present

## 2017-04-26 DIAGNOSIS — Z1239 Encounter for other screening for malignant neoplasm of breast: Secondary | ICD-10-CM

## 2017-07-02 IMAGING — MG MM DIGITAL SCREENING BILAT W/ CAD
4 series · 4 of 4 positions shown · non-contrast
Comparison: Previous exam(s).

CLINICAL DATA: Screening.

EXAM:
DIGITAL SCREENING BILATERAL MAMMOGRAM WITH CAD

[R MLO]
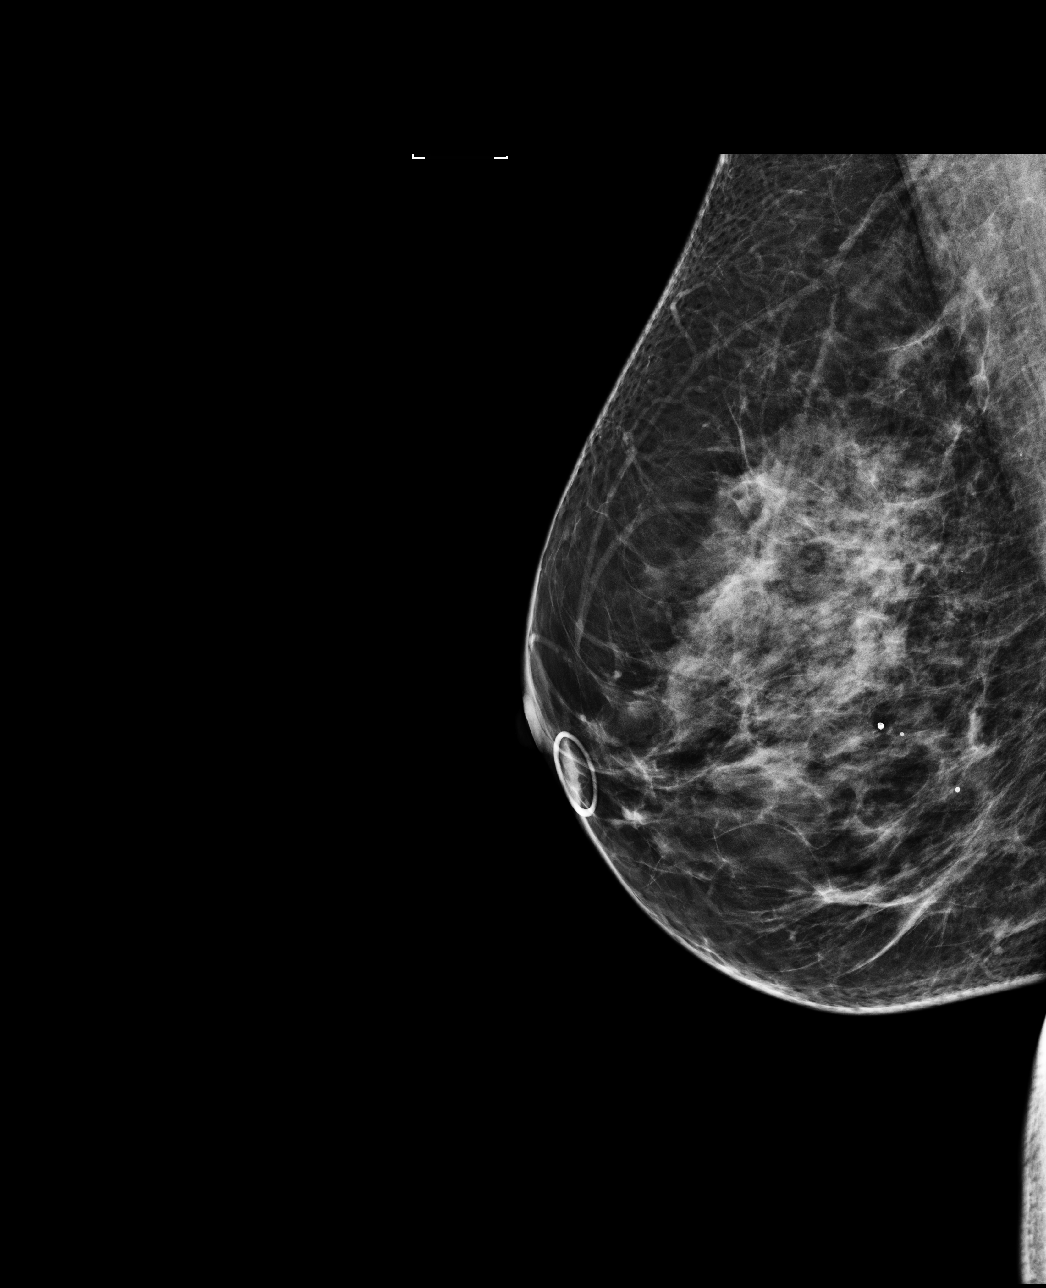

[L CC]
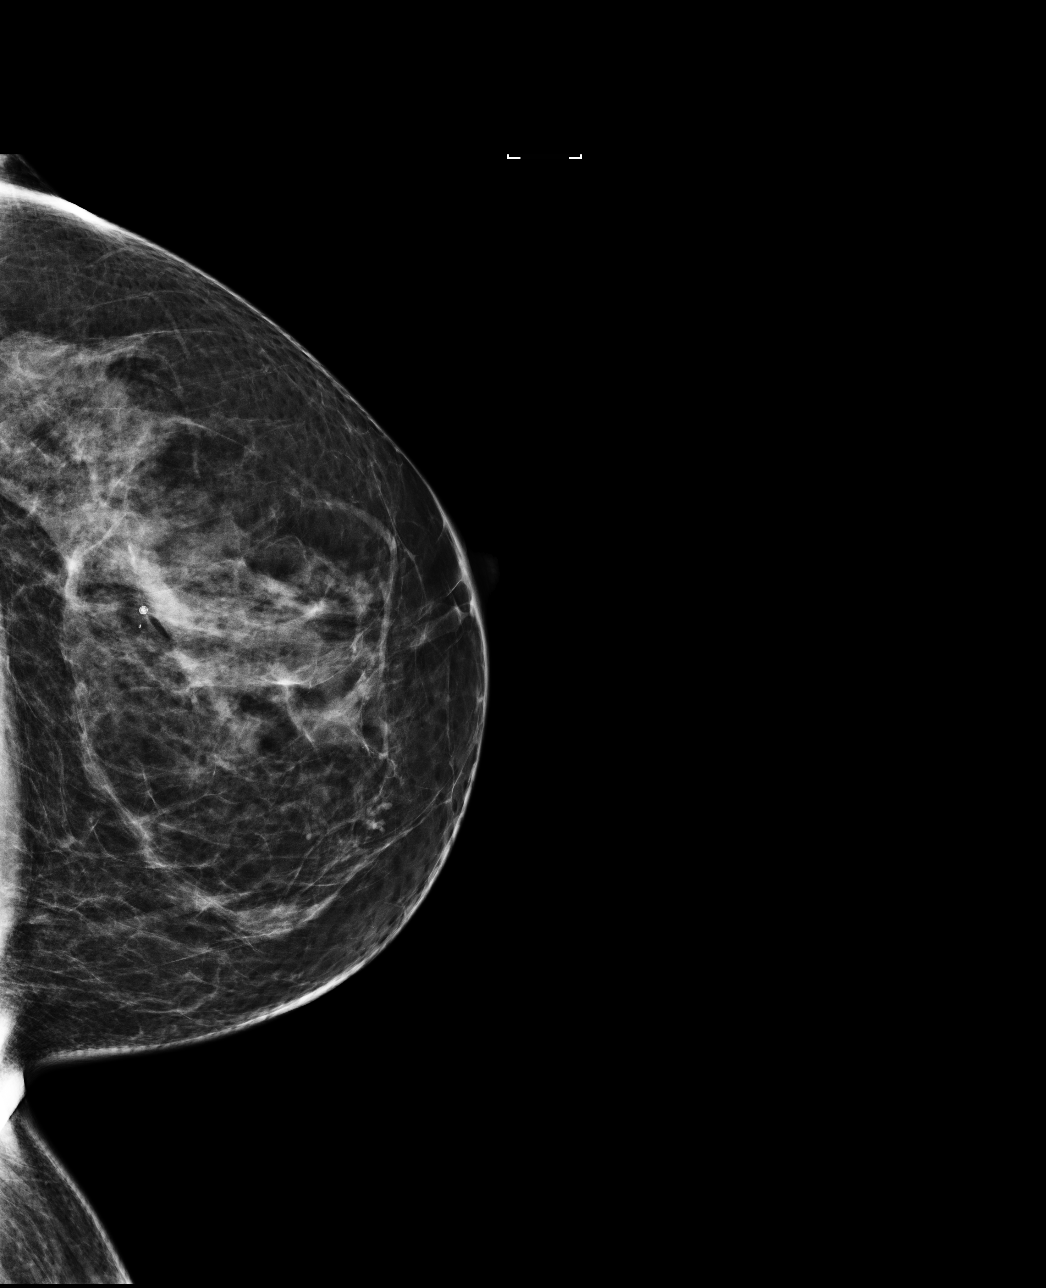

[L MLO]
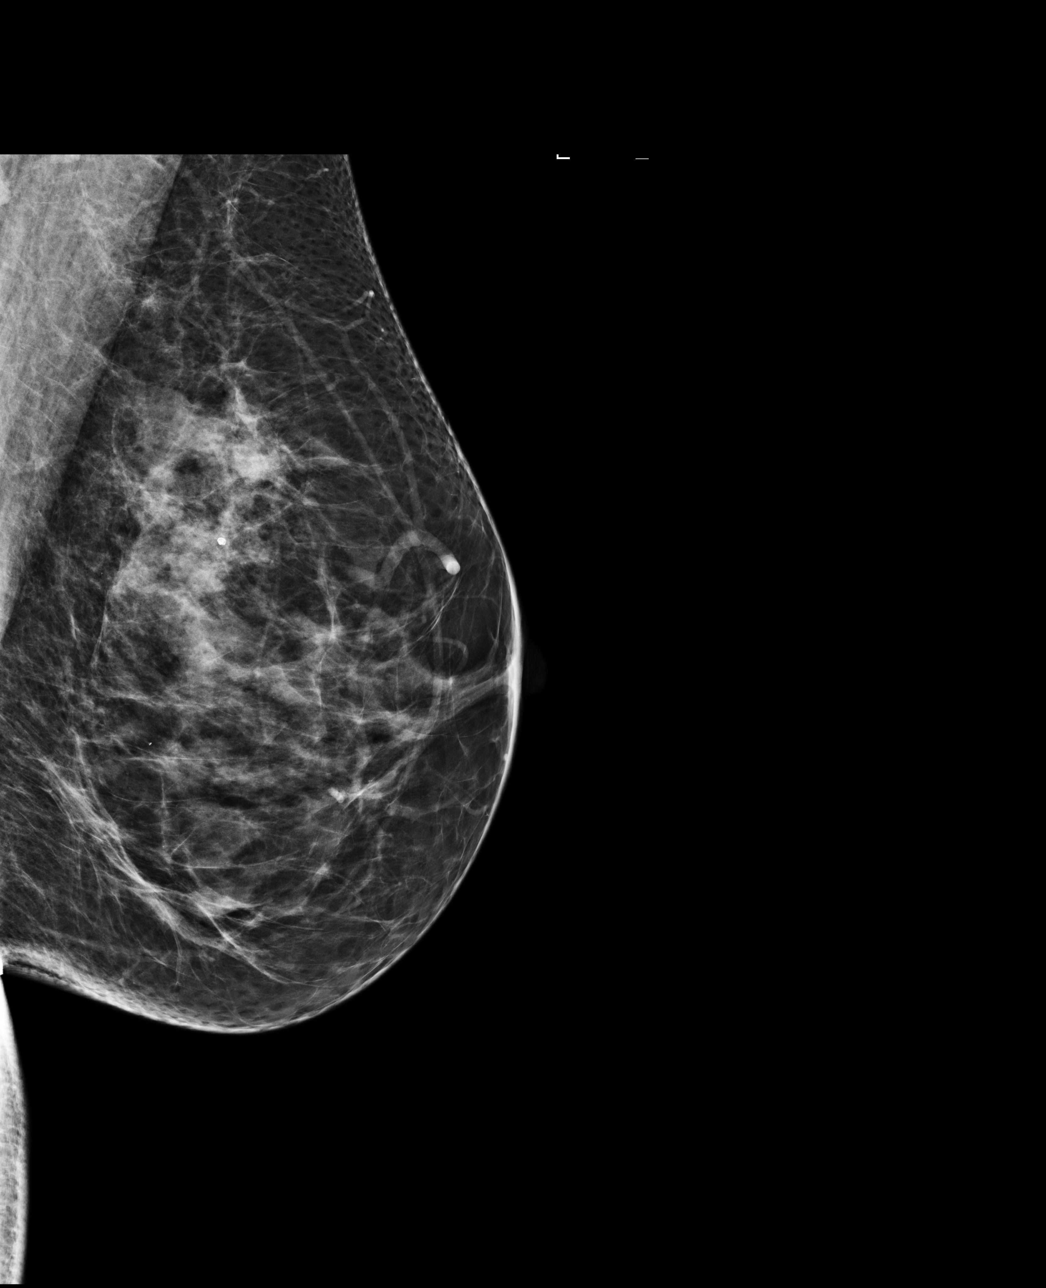

[R CC]
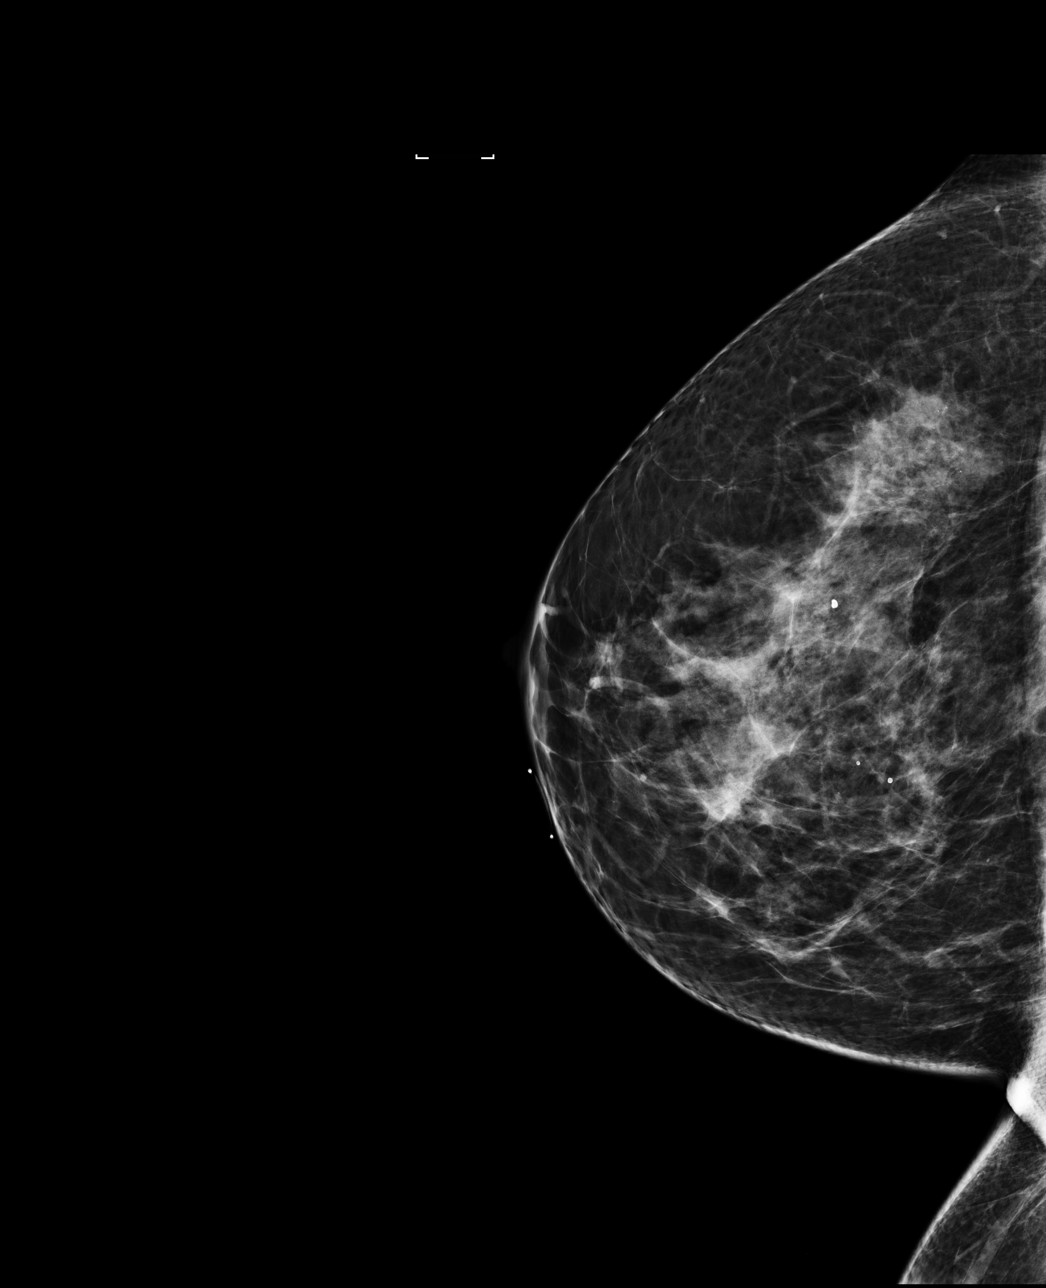

[4 of 4 positions shown; findings below may reference images not displayed]

ACR Breast Density Category b: There are scattered areas of
fibroglandular density.
FINDINGS: There are no findings suspicious for malignancy. Images were
processed with CAD.
IMPRESSION: No mammographic evidence of malignancy. A result letter of this
screening mammogram will be mailed directly to the patient.

RECOMMENDATION:
Screening mammogram in one year. (Code:[id number])

BI-RADS CATEGORY  1: Negative.

## 2018-04-21 ENCOUNTER — Other Ambulatory Visit: Payer: Self-pay | Admitting: Obstetrics & Gynecology

## 2018-04-21 DIAGNOSIS — Z1231 Encounter for screening mammogram for malignant neoplasm of breast: Secondary | ICD-10-CM

## 2018-05-06 ENCOUNTER — Ambulatory Visit
Admission: RE | Admit: 2018-05-06 | Discharge: 2018-05-06 | Disposition: A | Discharge status: Home / Self Care | Payer: BLUE CROSS/BLUE SHIELD | Source: Ambulatory Visit | Attending: Obstetrics & Gynecology | Admitting: Obstetrics & Gynecology

## 2018-05-06 DIAGNOSIS — Z1231 Encounter for screening mammogram for malignant neoplasm of breast: Secondary | ICD-10-CM | POA: Insufficient documentation

## 2019-11-08 ENCOUNTER — Ambulatory Visit: Payer: BC Managed Care – PPO | Attending: Internal Medicine

## 2019-11-08 ENCOUNTER — Other Ambulatory Visit: Payer: Self-pay

## 2019-11-08 DIAGNOSIS — Z20822 Contact with and (suspected) exposure to covid-19: Secondary | ICD-10-CM

## 2019-11-10 LAB — NOVEL CORONAVIRUS, NAA: SARS-CoV-2, NAA: DETECTED — AB

## 2020-03-07 ENCOUNTER — Other Ambulatory Visit: Payer: Self-pay | Admitting: Obstetrics & Gynecology

## 2020-03-08 ENCOUNTER — Other Ambulatory Visit: Payer: Self-pay | Admitting: Obstetrics & Gynecology

## 2020-03-08 DIAGNOSIS — Z1231 Encounter for screening mammogram for malignant neoplasm of breast: Secondary | ICD-10-CM

## 2020-04-04 ENCOUNTER — Ambulatory Visit
Admission: RE | Admit: 2020-04-04 | Discharge: 2020-04-04 | Disposition: A | Discharge status: Home / Self Care | Payer: BC Managed Care – PPO | Source: Ambulatory Visit | Attending: Obstetrics & Gynecology | Admitting: Obstetrics & Gynecology

## 2020-04-04 DIAGNOSIS — Z1231 Encounter for screening mammogram for malignant neoplasm of breast: Secondary | ICD-10-CM | POA: Insufficient documentation

## 2021-03-04 ENCOUNTER — Other Ambulatory Visit: Payer: Self-pay | Admitting: Obstetrics & Gynecology

## 2021-03-04 DIAGNOSIS — Z1231 Encounter for screening mammogram for malignant neoplasm of breast: Secondary | ICD-10-CM
